# Patient Record
Sex: Male | Born: 1985 | Race: Black or African American | Hispanic: No | Marital: Married | Smoking: Never smoker
Health system: Southern US, Community
[De-identification: ages and names within clinical notes are randomized; demographics above are authoritative.]

---

## 2019-04-09 ENCOUNTER — Ambulatory Visit: Payer: Self-pay | Attending: Family

## 2019-04-09 DIAGNOSIS — Z23 Encounter for immunization: Secondary | ICD-10-CM

## 2019-04-09 NOTE — Progress Notes (Signed)
   Covid-19 Vaccination Clinic  Name:  Eugene Blanchard    MRN: [id number] DOB: 02/13/1985  04/09/2019  [name] was observed post Covid-19 immunization for 15 minutes without incident. He was provided with Vaccine Information Sheet and instruction to access the V-Safe system.   [name] was instructed to call 911 with any severe reactions post vaccine: Marland Kitchen Difficulty breathing  . Swelling of face and throat  . A fast heartbeat  . A bad rash all over body  . Dizziness and weakness   Immunizations Administered    Name Date Dose VIS Date Route   Moderna COVID-19 Vaccine 04/09/2019  2:10 PM 0.5 mL 12/30/2018 Intramuscular   Manufacturer: Moderna   Lot: [id number]   NDC: 62836-629-47

## 2019-05-12 ENCOUNTER — Ambulatory Visit: Payer: Self-pay | Attending: Family

## 2019-05-12 ENCOUNTER — Other Ambulatory Visit: Payer: Self-pay

## 2019-05-12 DIAGNOSIS — Z23 Encounter for immunization: Secondary | ICD-10-CM

## 2019-05-12 NOTE — Progress Notes (Signed)
   Covid-19 Vaccination Clinic  Name:  Eugene Blanchard    MRN: [id number] DOB: 06/19/1985  05/12/2019  Eugene Blanchard was observed post Covid-19 immunization for 15 minutes without incident. He was provided with Vaccine Information Sheet and instruction to access the V-Safe system.   Eugene Blanchard was instructed to call 911 with any severe reactions post vaccine: Marland Kitchen Difficulty breathing  . Swelling of face and throat  . A fast heartbeat  . A bad rash all over body  . Dizziness and weakness   Immunizations Administered    Name Date Dose VIS Date Route   Moderna COVID-19 Vaccine 05/12/2019 11:40 AM 0.5 mL 12/30/2018 Intramuscular   Manufacturer: Moderna   Lot: [id number]   NDC: 52174-715-95

## 2019-12-09 ENCOUNTER — Ambulatory Visit: Payer: Self-pay | Attending: Family

## 2019-12-09 DIAGNOSIS — Z23 Encounter for immunization: Secondary | ICD-10-CM

## 2019-12-28 DIAGNOSIS — R946 Abnormal results of thyroid function studies: Secondary | ICD-10-CM | POA: Diagnosis not present

## 2019-12-28 DIAGNOSIS — R1084 Generalized abdominal pain: Secondary | ICD-10-CM | POA: Diagnosis not present

## 2019-12-28 DIAGNOSIS — E039 Hypothyroidism, unspecified: Secondary | ICD-10-CM | POA: Diagnosis not present

## 2019-12-30 DIAGNOSIS — R634 Abnormal weight loss: Secondary | ICD-10-CM | POA: Diagnosis not present

## 2019-12-30 DIAGNOSIS — R1013 Epigastric pain: Secondary | ICD-10-CM | POA: Diagnosis not present

## 2019-12-30 DIAGNOSIS — R197 Diarrhea, unspecified: Secondary | ICD-10-CM | POA: Diagnosis not present

## 2019-12-31 ENCOUNTER — Other Ambulatory Visit: Payer: Self-pay | Admitting: Gastroenterology

## 2019-12-31 DIAGNOSIS — R1011 Right upper quadrant pain: Secondary | ICD-10-CM

## 2020-01-07 DIAGNOSIS — R1013 Epigastric pain: Secondary | ICD-10-CM | POA: Diagnosis not present

## 2020-01-07 DIAGNOSIS — B9681 Helicobacter pylori [H. pylori] as the cause of diseases classified elsewhere: Secondary | ICD-10-CM | POA: Diagnosis not present

## 2020-01-07 DIAGNOSIS — R634 Abnormal weight loss: Secondary | ICD-10-CM | POA: Diagnosis not present

## 2020-01-07 DIAGNOSIS — K2950 Unspecified chronic gastritis without bleeding: Secondary | ICD-10-CM | POA: Diagnosis not present

## 2020-01-15 ENCOUNTER — Ambulatory Visit
Admission: RE | Admit: 2020-01-15 | Discharge: 2020-01-15 | Disposition: A | Discharge status: Home / Self Care | Payer: BC Managed Care – PPO | Source: Ambulatory Visit | Attending: Gastroenterology | Admitting: Gastroenterology

## 2020-01-15 DIAGNOSIS — K8020 Calculus of gallbladder without cholecystitis without obstruction: Secondary | ICD-10-CM | POA: Diagnosis not present

## 2020-01-15 DIAGNOSIS — R1011 Right upper quadrant pain: Secondary | ICD-10-CM

## 2020-03-03 NOTE — Progress Notes (Signed)
   Covid-19 Vaccination Clinic  Name:  Eugene Blanchard    MRN: [id number] DOB: 01-16-1986  03/03/2020  [name] was observed post Covid-19 immunization for 15 minutes without incident. He was provided with Vaccine Information Sheet and instruction to access the V-Safe system.   [name] was instructed to call 911 with any severe reactions post vaccine: Marland Kitchen Difficulty breathing  . Swelling of face and throat  . A fast heartbeat  . A bad rash all over body  . Dizziness and weakness   Immunizations Administered    Name Date Dose VIS Date Route   Moderna Covid-19 Booster Vaccine 12/09/2019 10:30 AM 0.25 mL 11/18/2019 Intramuscular   Manufacturer: Moderna   Lot: [id number]   NDC: 54562-563-89

## 2020-09-08 DIAGNOSIS — R946 Abnormal results of thyroid function studies: Secondary | ICD-10-CM | POA: Diagnosis not present

## 2020-09-08 DIAGNOSIS — R1084 Generalized abdominal pain: Secondary | ICD-10-CM | POA: Diagnosis not present

## 2021-02-20 DIAGNOSIS — R1030 Lower abdominal pain, unspecified: Secondary | ICD-10-CM | POA: Diagnosis not present

## 2021-04-26 ENCOUNTER — Ambulatory Visit (HOSPITAL_COMMUNITY)
Admission: EM | Admit: 2021-04-26 | Discharge: 2021-04-26 | Disposition: A | Discharge status: Home / Self Care | Payer: BC Managed Care – PPO | Attending: Physician Assistant | Admitting: Physician Assistant

## 2021-04-26 ENCOUNTER — Encounter (HOSPITAL_COMMUNITY): Payer: Self-pay

## 2021-04-26 DIAGNOSIS — R1030 Lower abdominal pain, unspecified: Secondary | ICD-10-CM | POA: Diagnosis not present

## 2021-04-26 DIAGNOSIS — R198 Other specified symptoms and signs involving the digestive system and abdomen: Secondary | ICD-10-CM | POA: Diagnosis not present

## 2021-04-26 LAB — POCT URINALYSIS DIPSTICK, ED / UC
Bilirubin Urine: NEGATIVE
Glucose, UA: NEGATIVE mg/dL
Ketones, ur: NEGATIVE mg/dL
Leukocytes,Ua: NEGATIVE
Nitrite: NEGATIVE
Protein, ur: NEGATIVE mg/dL
Specific Gravity, Urine: 1.015 (ref 1.005–1.030)
Urobilinogen, UA: 0.2 mg/dL (ref 0.0–1.0)
pH: 6 (ref 5.0–8.0)

## 2021-04-26 MED ORDER — DICYCLOMINE HCL 20 MG PO TABS: 20.0000 mg | ORAL_TABLET | Freq: Two times a day (BID) | ORAL | 0 refills | Status: DC

## 2021-04-26 NOTE — Discharge Instructions (Signed)
Your urine did show some hemoglobin but was otherwise normal.  I would like you to follow-up with PCP.  We will try to establish you with 1 so someone to call to schedule an appointment.  Try dicyclomine twice daily.  You should avoid spicy/acidic/fatty foods.  Make sure you rest and drink plenty of fluid.  Please call to schedule an appointment with gastroenterology for further evaluation and management. ?

## 2021-04-26 NOTE — ED Triage Notes (Signed)
Pt presents with c/o abd pain X 10 days.  ? ?States he often has pain. Pt states he was diagnosed with IBS. Pt states the pain has worsened.  ?

## 2021-04-26 NOTE — ED Provider Notes (Signed)
?[facility] ? ? ? ?CSN: [id number] ?Arrival date & time: 04/26/21  1941 ? ? ?  ? ?History   ?Chief Complaint ?Chief Complaint  ?Patient presents with  ? Abdominal Pain  ? ? ?HPI ?Eugene Blanchard is a 36 y.o. male.  ? ?Patient presents today with a several year history of intermittent abdominal pain.  He reports this is generally widespread and cramping that improves minimally with a bowel movement.  Over the past 10 days this has become more persistent and is localized to lower abdomen.  He denies formal diagnosis of IBS but has been told this is the likely cause of symptoms.  He has previously been evaluated for acid reflux and H. pylori via EGD but has not had a colonoscopy.  He is not currently followed by GI specialist.  He denies any melena, hematochezia, nausea, vomiting, fever, weight loss.  He has taken Tylenol which provides temporary relief of symptoms.  Currently pain is rated 4 on a 0-10 pain scale, localized to lower abdomen, described as cramping, no aggravating relieving factors identified.  He denies any previous abdominal surgery and still has gallbladder and appendix.  Denies any recent medication changes, antibiotic use, suspicious food intake, recent travel, known sick contacts. ? ? ?History reviewed. No pertinent past medical history. ? ?There are no problems to display for this patient. ? ? ?History reviewed. No pertinent surgical history. ? ? ? ? ?Home Medications   ? ?Prior to Admission medications   ?Medication Sig Start Date End Date Taking? Authorizing Provider  ?dicyclomine (BENTYL) 20 MG tablet Take 1 tablet (20 mg total) by mouth 2 (two) times daily. 04/26/21  Yes Marlana Mckowen, Noberto Retort, PA-C  ? ? ?Family History ?History reviewed. No pertinent family history. ? ?Social History ?Social History  ? ?Tobacco Use  ? Smoking status: Never  ? Smokeless tobacco: Never  ?Substance Use Topics  ? Alcohol use: Never  ? Drug use: Never  ? ? ? ?Allergies   ?Patient has no known allergies. ? ? ?Review  of Systems ?Review of Systems  ?Constitutional:  Positive for activity change. Negative for appetite change, fatigue and fever.  ?Respiratory:  Negative for cough and shortness of breath.   ?Cardiovascular:  Negative for chest pain.  ?Gastrointestinal:  Positive for abdominal pain and diarrhea. Negative for nausea and vomiting.  ?Neurological:  Negative for dizziness, light-headedness and headaches.  ? ? ?Physical Exam ?Triage Vital Signs ?ED Triage Vitals  ?Enc Vitals Group  ?   BP 04/26/21 2004 121/73  ?   Pulse Rate 04/26/21 2004 75  ?   Resp 04/26/21 2004 17  ?   Temp 04/26/21 2004 98.2 ?F (36.8 ?C)  ?   Temp Source 04/26/21 2004 Oral  ?   SpO2 04/26/21 2004 100 %  ?   Weight --   ?   Height --   ?   Head Circumference --   ?   Peak Flow --   ?   Pain Score 04/26/21 2000 2  ?   Pain Loc --   ?   Pain Edu? --   ?   Excl. in GC? --   ? ?No data found. ? ?Updated Vital Signs ?BP 121/73 (BP Location: Right Arm)   Pulse 75   Temp 98.2 ?F (36.8 ?C) (Oral)   Resp 17   SpO2 100%  ? ?Visual Acuity ?Right Eye Distance:   ?Left Eye Distance:   ?Bilateral Distance:   ? ?Right Eye Near:   ?  Left Eye Near:    ?Bilateral Near:    ? ?Physical Exam ?Vitals reviewed.  ?Constitutional:   ?   General: He is awake.  ?   Appearance: Normal appearance. He is well-developed. He is not ill-appearing.  ?   Comments: Very pleasant male appears stated age in no acute distress sitting comfortably in exam room  ?HENT:  ?   Head: Normocephalic and atraumatic.  ?   Mouth/Throat:  ?   Mouth: Mucous membranes are moist.  ?   Pharynx: Uvula midline. No oropharyngeal exudate or posterior oropharyngeal erythema.  ?Cardiovascular:  ?   Rate and Rhythm: Normal rate and regular rhythm.  ?   Heart sounds: Normal heart sounds, S1 normal and S2 normal. No murmur heard. ?Pulmonary:  ?   Effort: Pulmonary effort is normal.  ?   Breath sounds: Normal breath sounds. No stridor. No wheezing, rhonchi or rales.  ?   Comments: Clear to auscultation  bilaterally ?Abdominal:  ?   General: Bowel sounds are normal.  ?   Palpations: Abdomen is soft.  ?   Tenderness: There is abdominal tenderness in the right lower quadrant, suprapubic area and left lower quadrant. There is no right CVA tenderness, left CVA tenderness, guarding or rebound.  ?   Comments: Tenderness palpation throughout lower abdomen.  No evidence of acute abdomen on physical exam.  ?Neurological:  ?   Mental Status: He is alert.  ?Psychiatric:     ?   Behavior: Behavior is cooperative.  ? ? ? ?UC Treatments / Results  ?Labs ?(all labs ordered are listed, but only abnormal results are displayed) ?Labs Reviewed  ?POCT URINALYSIS DIPSTICK, ED / UC - Abnormal; Notable for the following components:  ?    Result Value  ? Hgb urine dipstick TRACE (*)   ? All other components within normal limits  ?CYTOLOGY, (ORAL, ANAL, URETHRAL) ANCILLARY ONLY  ? ? ?EKG ? ? ?Radiology ?No results found. ? ?Procedures ?Procedures (including critical care time) ? ?Medications Ordered in UC ?Medications - No data to display ? ?Initial Impression / Assessment and Plan / UC Course  ?I have reviewed the triage vital signs and the nursing notes. ? ?Pertinent labs & imaging results that were available during my care of the patient were reviewed by me and considered in my medical decision making (see chart for details). ? ?  ? ?Vital signs and physical exam reassuring today; no indication for emergent evaluation or imaging.  UA showed trace hemoglobin without signs of infection.  Suspect irritable bowel given clinical presentation.  Recommended he follow-up with GI for further evaluation and management including consideration of colonoscopy.  He was given contact information for local provider and encouraged to call to schedule an appointment.  We will start dicyclomine to help manage symptoms in the meantime.  Recommended bland diet and drinking plenty of fluids.  Discussed that if his symptoms worsen anyway and he has a severe  focal abdominal pain or develops any fever, melena, hematochezia, nausea, vomiting he needs to go to the emergency room for further evaluation since we do not have imaging capabilities in urgent care; patient expressed understanding.  Strict return precautions given. ? ?Final Clinical Impressions(s) / UC Diagnoses  ? ?Final diagnoses:  ?Lower abdominal pain  ?Abnormal bowel movement  ? ? ? ?Discharge Instructions   ? ?  ?Your urine did show some hemoglobin but was otherwise normal.  I would like you to follow-up with PCP.  We will try  to establish you with 1 so someone to call to schedule an appointment.  Try dicyclomine twice daily.  You should avoid spicy/acidic/fatty foods.  Make sure you rest and drink plenty of fluid.  Please call to schedule an appointment with gastroenterology for further evaluation and management. ? ? ? ?ED Prescriptions   ? ? Medication Sig Dispense Auth. Provider  ? dicyclomine (BENTYL) 20 MG tablet Take 1 tablet (20 mg total) by mouth 2 (two) times daily. 20 tablet Kerra Guilfoil K, PA-C  ? ?  ? ?PDMP not reviewed this encounter. ?  ?Jeani Hawking, PA-C ?04/26/21 2126 ? ?

## 2021-04-26 NOTE — ED Notes (Addendum)
Wrong patient documentation.

## 2021-04-27 ENCOUNTER — Encounter: Payer: Self-pay | Admitting: Physician Assistant

## 2021-04-27 LAB — CYTOLOGY, (ORAL, ANAL, URETHRAL) ANCILLARY ONLY
Chlamydia: NEGATIVE
Comment: NEGATIVE
Comment: NEGATIVE
Comment: NORMAL
Neisseria Gonorrhea: NEGATIVE
Trichomonas: NEGATIVE

## 2021-05-17 ENCOUNTER — Other Ambulatory Visit: Payer: BC Managed Care – PPO

## 2021-05-17 ENCOUNTER — Ambulatory Visit: Payer: BC Managed Care – PPO | Admitting: Physician Assistant

## 2021-05-17 ENCOUNTER — Encounter: Payer: Self-pay | Admitting: Physician Assistant

## 2021-05-17 VITALS — BP 100/60 | HR 73 | Ht 71.0 in | Wt 131.4 lb

## 2021-05-17 DIAGNOSIS — R194 Change in bowel habit: Secondary | ICD-10-CM | POA: Diagnosis not present

## 2021-05-17 DIAGNOSIS — K2970 Gastritis, unspecified, without bleeding: Secondary | ICD-10-CM

## 2021-05-17 DIAGNOSIS — R1030 Lower abdominal pain, unspecified: Secondary | ICD-10-CM | POA: Diagnosis not present

## 2021-05-17 DIAGNOSIS — B9681 Helicobacter pylori [H. pylori] as the cause of diseases classified elsewhere: Secondary | ICD-10-CM

## 2021-05-17 MED ORDER — DICYCLOMINE HCL 20 MG PO TABS: 20.0000 mg | ORAL_TABLET | Freq: Two times a day (BID) | ORAL | 2 refills | Status: AC

## 2021-05-17 NOTE — Progress Notes (Addendum)
? ?Chief Complaint: Lower abdominal pain and change in bowel habits ? ?HPI: ?   Eugene Blanchard is a 36 year old African male, who presents to clinic today after being seen in the ER for lower abdominal pain. ?   01/15/2020 right upper quadrant ultrasound with cholelithiasis without evidence of acute cholecystitis ?   04/26/2021 patient presented to the ER with a several year history of intermittent lower abdominal pain which she described as widespread cramping that minimally improved with a bowel movement.  It has been persistent over the past 10 days.  Described previous EGD and diagnosis of H. pylori but no previous colonoscopy.  At that time urinalysis was normal.  Was recommended he follow-up with a gastroenterologist for possible colonoscopy.  He was given Dicyclomine. ?   Today, the patient tells me that he moved here in 2019 and since then he has had some trouble with what has been discussed as IBS symptoms in the past by his school nurse.  Tells me he develops lower abdominal pain which can be anywhere from a 4-7/10 and with this pain comes more frequent mushy/liquid bowel movements.  The pain does minimally improve after a bowel movement.  Tells me this will last for at least 2 weeks and be constant, the things that seem to help are sometimes exercise and a change in his diet towards a low FODMAP diet as well as a fiber supplement and heating pads or hot shower.  Tells me this has happened approximately 4-5 times a year over the past 2 to 3 years.  Also worsened by spicy foods and cold weather as well as stress and anxiety.  Notes weight loss as well during these episodes because he does not eat his normal diet.  Most recently was around 145 pounds in January down to 131 today.  Did try Dicyclomine prescribed in the ER and took this 20 mg twice a day which helped with the pain but he does not want to "cover up something".  Tells me when he is on the low FODMAP diet he has low energy. ?   Describes previous  EGD and diagnosis of H. pylori which he never took the treatment for. ?   Student at a local college. ?   Denies fever, chills, blood in his stool or symptoms that awaken him from sleep. ? ?Past medical history: ?H. pylori ? ?Past surgical history: ?None ? ?Current Outpatient Medications  ?Medication Sig Dispense Refill  ? dicyclomine (BENTYL) 20 MG tablet Take 1 tablet (20 mg total) by mouth 2 (two) times daily. 20 tablet 0  ? ?No current facility-administered medications for this visit.  ? ? ?Allergies as of 05/17/2021  ? (No Known Allergies)  ? ? ?Family history: ?No GI cancers ? ?Social History  ? ?Socioeconomic History  ? Marital status: Married  ?  Spouse name: Not on file  ? Number of children: Not on file  ? Years of education: Not on file  ? Highest education level: Not on file  ?Occupational History  ? Not on file  ?Tobacco Use  ? Smoking status: Never  ? Smokeless tobacco: Never  ?Substance and Sexual Activity  ? Alcohol use: Never  ? Drug use: Never  ? Sexual activity: Not on file  ?Other Topics Concern  ? Not on file  ?Social History Narrative  ? Not on file  ? ?Social Determinants of Health  ? ?Financial Resource Strain: Not on file  ?Food Insecurity: Not on file  ?Transportation Needs:  Not on file  ?Physical Activity: Not on file  ?Stress: Not on file  ?Social Connections: Not on file  ?Intimate Partner Violence: Not on file  ? ? ?Review of Systems:    ?Constitutional: No weight loss, fever or chills ?Skin: No rash ?Cardiovascular: No chest pain ?Respiratory: No SOB  ?Gastrointestinal: See HPI and otherwise negative ?Genitourinary: No dysuria  ?Neurological: No headache, dizziness or syncope ?Musculoskeletal: No new muscle or joint pain ?Hematologic: No bleeding  ?Psychiatric: No history of depression or anxiety ? ? Physical Exam:  ?Vital signs: ?BP 100/60   Pulse 73   Ht 5\' 11"  (1.803 m)   Wt 131 lb 6.4 oz (59.6 kg)   BMI 18.33 kg/m?   ? ?Constitutional:   Pleasant African male appears to be in  NAD, Well developed, Well nourished, alert and cooperative ?Head:  Normocephalic and atraumatic. ?Eyes:   PEERL, EOMI. No icterus. Conjunctiva pink. ?Ears:  Normal auditory acuity. ?Neck:  Supple ?Throat: Oral cavity and pharynx without inflammation, swelling or lesion.  ?Respiratory: Respirations even and unlabored. Lungs clear to auscultation bilaterally.   No wheezes, crackles, or rhonchi.  ?Cardiovascular: Normal S1, S2. No MRG. Regular rate and rhythm. No peripheral edema, cyanosis or pallor.  ?Gastrointestinal:  Soft, nondistended, mild suprapubic/lower abdominal TTP, no rebound or guarding. Normal bowel sounds. No appreciable masses or hepatomegaly. ?Rectal:  Not performed.  ?Msk:  Symmetrical without gross deformities. Without edema, no deformity or joint abnormality.  ?Neurologic:  Alert and  oriented x4;  grossly normal neurologically.  ?Skin:   Dry and intact without significant lesions or rashes. ?Psychiatric: Demonstrates good judgement and reason without abnormal affect or behaviors. ? ?See HPI for recent labs and imaging. ? ?Assessment: ?1.  Lower abdominal pain: Bilateral lower abdominal pain/suprapubic about 4-5 times a year which can last for 2 weeks and comes with more frequent loose stools, often during times of stress/anxiety or after eating trigger foods, Dicyclomine helped recently; most likely IBS ?2.  Change in bowel habits: As above, currently normal ?3.  History of H. pylori: Describes EGD last year with Dr. and diagnosed with H. pylori but this was never treated ? ?Plan: ?1.  We will request records from Dr. Elnoria Howard in regards to EGD and biopsies.  If patient truly was positive for H. pylori then would recommend treatment for this. ?2.  Scheduled patient for diagnostic colonoscopy in the [facility] with Dr. Elnoria Howard.  Did provide the patient a detailed list of risks for the procedure and he agrees to proceed. Patient is appropriate for endoscopic procedure(s) in the ambulatory ([facility]) setting.   ?3.  Discussed prescribing Dicyclomine but patient would prefer to wait on this until he gets an actual diagnosis. ?4.  Ordered labs for celiac studies today. ?5.  Patient to follow in clinic per recommendations from Dr. Christella Hartigan after time of procedure. ? ?Christella Hartigan, PA-C ?[facility] ?05/17/2021, 10:13 AM ? ?Addendum: ?05/18/2021 3:23 PM ? ?Received records from patient's last EGD 01/07/2020 ? ?Patient had EGD with Dr. 14/09/2019.  Noted to have normal esophagus, stomach and duodenum.  Biopsies were positive for H. 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 ? ?Patient will need treatment for H. pylori with quadruple therapy.  We will ensure this is prescribed for him.  He will then also need repeat H. pylori breath test in 4 to 6 weeks after finishing treatment. ? ?Above could be contributing to his current symptoms. ? ?Elnoria Howard, PA-C ? ?

## 2021-05-17 NOTE — Patient Instructions (Signed)
Your provider has requested that you go to the basement level for lab work before leaving today. Press "B" on the elevator. The lab is located at the first door on the left as you exit the elevator. ? ? ?You have been scheduled for a colonoscopy. Please follow written instructions given to you at your visit today.  ?Please pick up your prep supplies at the pharmacy within the next 1-3 days. ?If you use inhalers (even only as needed), please bring them with you on the day of your procedure. ? ?If you are age 39 or younger, your body mass index should be between 19-25. Your Body mass index is 18.33 kg/m?Marland Kitchen If this is out of the aformentioned range listed, please consider follow up with your Primary Care Provider.  ? ?________________________________________________________ ? ?The [facility] providers would like to encourage you to use [facility] to communicate with providers for non-urgent requests or questions.  Due to long hold times on the telephone, sending your provider a message by [facility] may be a faster and more efficient way to get a response.  Please allow 48 business hours for a response.  Please remember that this is for non-urgent requests.  ?_______________________________________________________ ? ?

## 2021-05-17 NOTE — Progress Notes (Signed)
I agree with the above note, plan 

## 2021-05-17 NOTE — Telephone Encounter (Signed)
Yes, Dicyclomine 20mg  BID. #60 refill x2  ?Thanks-JLL  ? ?RX for Dicyclomine sent to pharmacy on file. ?

## 2021-05-18 ENCOUNTER — Telehealth: Payer: Self-pay

## 2021-05-18 DIAGNOSIS — A048 Other specified bacterial intestinal infections: Secondary | ICD-10-CM

## 2021-05-18 LAB — IGA: Immunoglobulin A: 172 mg/dL (ref 47–310)

## 2021-05-18 LAB — TISSUE TRANSGLUTAMINASE ABS,IGG,IGA
(tTG) Ab, IgA: <1 U/mL
(tTG) Ab, IgG: <1 U/mL

## 2021-05-18 MED ORDER — METRONIDAZOLE 250 MG PO TABS: 250.0000 mg | ORAL_TABLET | Freq: Four times a day (QID) | ORAL | 0 refills | Status: AC

## 2021-05-18 MED ORDER — BISMUTH SUBSALICYLATE 262 MG PO TABS
524.0000 mg | ORAL_TABLET | Freq: Four times a day (QID) | ORAL | 0 refills | Status: AC
Start: 2021-05-18 — End: 2021-06-01

## 2021-05-18 MED ORDER — TETRACYCLINE HCL 500 MG PO CAPS: 500.0000 mg | ORAL_CAPSULE | Freq: Four times a day (QID) | ORAL | 0 refills | Status: AC

## 2021-05-18 MED ORDER — OMEPRAZOLE 20 MG PO CPDR: 20.0000 mg | DELAYED_RELEASE_CAPSULE | Freq: Two times a day (BID) | ORAL | 0 refills | Status: AC

## 2021-05-18 NOTE — Telephone Encounter (Signed)
Patient notified of results and recommendations via MyChart message. ?Prescriptions sent to pharmacy on file. ?Lab order and reminder in epic. ?

## 2021-05-18 NOTE — Telephone Encounter (Signed)
-----   Message from Unk Lightning, [place] sent at 05/18/2021  3:25 PM EDT ----- ?Regarding: h pylori ?Please let patient know that we did get his results from EGD with Dr. Elnoria Howard in December 2021.  This was positive for H. pylori which was never treated.  Please prescribe quadruple therapy for him now as well as repeat H. pylori breath testing 4 to 6 weeks afterwards. ? ?Omeprazole 20 mg twice dailyx14d ? ?Bismuth 300-524 mg 4 times dailyx14d ? ?Tetracycline 500 mg 4 times dailyx14d ? ?Metronidazole 250 mg 4 times dailyx14d ? ?Thanks, JL L ? ? ? ?

## 2021-05-19 NOTE — Telephone Encounter (Signed)
Patient reviewed MyChart message. ?Last read by Pearletha Forge at  9:30 AM on 05/19/2021. ?

## 2021-05-24 ENCOUNTER — Encounter: Payer: Self-pay | Admitting: Gastroenterology

## 2021-05-31 ENCOUNTER — Encounter: Payer: Self-pay | Admitting: Gastroenterology

## 2021-05-31 ENCOUNTER — Ambulatory Visit (AMBULATORY_SURGERY_CENTER): Payer: BC Managed Care – PPO | Admitting: Gastroenterology

## 2021-05-31 VITALS — BP 109/69 | HR 66 | Temp 97.8°F | Resp 10 | Ht 71.0 in | Wt 131.0 lb

## 2021-05-31 DIAGNOSIS — R194 Change in bowel habit: Secondary | ICD-10-CM

## 2021-05-31 DIAGNOSIS — R1030 Lower abdominal pain, unspecified: Secondary | ICD-10-CM | POA: Diagnosis not present

## 2021-05-31 DIAGNOSIS — A048 Other specified bacterial intestinal infections: Secondary | ICD-10-CM

## 2021-05-31 DIAGNOSIS — B9681 Helicobacter pylori [H. pylori] as the cause of diseases classified elsewhere: Secondary | ICD-10-CM

## 2021-05-31 MED ORDER — SODIUM CHLORIDE 0.9 % IV SOLN: 500.0000 mL | Freq: Once | INTRAVENOUS | Status: DC

## 2021-05-31 NOTE — Progress Notes (Signed)
?  The recent H&P (dated in the past 30 days)) was reviewed, the patient was examined and there is no change in the patients condition since that H&P was completed. ? ? ?Eugene Blanchard  05/31/2021, 2:38 PM ? ?

## 2021-05-31 NOTE — Patient Instructions (Signed)
Please complete the H. pylori antibiotics that were called in a few weeks ago and ?continue with plans for H. pylori breath testing 6-8 weeks after you have completed those ?antibiotics. ? ?YOU HAD AN ENDOSCOPIC PROCEDURE TODAY: Refer to the procedure report and other information in the discharge instructions given to you for any specific questions about what was found during the examination. If this information does not answer your questions, please call [place] office at [phone] to clarify.  ? ?YOU SHOULD EXPECT: Some feelings of bloating in the abdomen. Passage of more gas than usual. Walking can help get rid of the air that was put into your GI tract during the procedure and reduce the bloating. If you had a lower endoscopy (such as a colonoscopy or flexible sigmoidoscopy) you may notice spotting of blood in your stool or on the toilet paper. Some abdominal soreness may be present for a day or two, also. ? ?DIET: Your first meal following the procedure should be a light meal and then it is ok to progress to your normal diet. A half-sandwich or bowl of soup is an example of a good first meal. Heavy or fried foods are harder to digest and may make you feel nauseous or bloated. Drink plenty of fluids but you should avoid alcoholic beverages for 24 hours. If you had a esophageal dilation, please see attached instructions for diet.   ? ?ACTIVITY: Your care partner should take you home directly after the procedure. You should plan to take it easy, moving slowly for the rest of the day. You can resume normal activity the day after the procedure however YOU SHOULD NOT DRIVE, use power tools, machinery or perform tasks that involve climbing or major physical exertion for 24 hours (because of the sedation medicines used during the test).  ? ?SYMPTOMS TO REPORT IMMEDIATELY: ?A gastroenterologist can be reached at any hour. Please call [phone]  for any of the following symptoms:  ?Following lower endoscopy  (colonoscopy, flexible sigmoidoscopy) ?Excessive amounts of blood in the stool  ?Significant tenderness, worsening of abdominal pains  ?Swelling of the abdomen that is new, acute  ?Fever of 100? or higher  ? ?FOLLOW UP:  ?If any biopsies were taken you will be contacted by phone or by letter within the next 1-3 weeks. Call [phone]  if you have not heard about the biopsies in 3 weeks.  ?Please also call with any specific questions about appointments or follow up tests.  ?

## 2021-05-31 NOTE — Progress Notes (Signed)
Pt non-responsive, VVS, Report to RN  °

## 2021-05-31 NOTE — Op Note (Signed)
[place] ?Patient Name: Eugene Blanchard ?Procedure Date: 05/31/2021 2:40 PM ?MRN: [id number] ?Endoscopist: Milus Banister , MD ?Age: 36 ?Referring MD:  ?Date of Birth: 1986/01/12 ?Gender: Male ?Account #: [id number] ?Procedure:                Colonoscopy ?Indications:              Lower abdominal pain, intermittent ?Medicines:                Monitored Anesthesia Care ?Procedure:                Pre-Anesthesia Assessment: ?                          - Prior to the procedure, a History and Physical  ?                          was performed, and patient medications and  ?                          allergies were reviewed. The patient's tolerance of  ?                          previous anesthesia was also reviewed. The risks  ?                          and benefits of the procedure and the sedation  ?                          options and risks were discussed with the patient.  ?                          All questions were answered, and informed consent  ?                          was obtained. Prior Anticoagulants: The patient has  ?                          taken no previous anticoagulant or antiplatelet  ?                          agents. ASA Grade Assessment: II - A patient with  ?                          mild systemic disease. After reviewing the risks  ?                          and benefits, the patient was deemed in  ?                          satisfactory condition to undergo the procedure. ?                          After obtaining informed consent, the colonoscope  ?  was passed under direct vision. Throughout the  ?                          procedure, the patient's blood pressure, pulse, and  ?                          oxygen saturations were monitored continuously. The  ?                          Olympus [id number]) Colonoscope was  ?                          introduced through the anus and advanced to the the  ?                          cecum, identified by  appendiceal orifice and  ?                          ileocecal valve. The colonoscopy was performed  ?                          without difficulty. The patient tolerated the  ?                          procedure well. The quality of the bowel  ?                          preparation was good. The ileocecal valve,  ?                          appendiceal orifice, and rectum were photographed. ?Scope In: 3:00:09 PM ?Scope Out: 3:12:16 PM ?Scope Withdrawal Time: 0 hours 8 minutes 47 seconds  ?Total Procedure Duration: 0 hours 12 minutes 7 seconds  ?Findings:                 The entire examined colon appeared normal on direct  ?                          and retroflexion views. ?Complications:            No immediate complications. Estimated blood loss:  ?                          None. ?Estimated Blood Loss:     Estimated blood loss: none. ?Impression:               - The entire examined colon is normal on direct and  ?                          retroflexion views. ?                          - No polyps or cancers. ?Recommendation:           - Patient has a contact number available for  ?  emergencies. The signs and symptoms of potential  ?                          delayed complications were discussed with the  ?                          patient. Return to normal activities tomorrow.  ?                          Written discharge instructions were provided to the  ?                          patient. ?                          - Resume previous diet. ?                          - Continue present medications. ?                          - Please complete the H. pylori antibiotics that  ?                          were called in a few weeks ago and continue with  ?                          plans for H. pylori breath testing 6-8 weeks after  ?                          you have completed those antibiotics. ?Milus Banister, MD ?05/31/2021 3:16:07 PM ?This report has been signed electronically. ?

## 2021-05-31 NOTE — Progress Notes (Signed)
Pt's states no medical or surgical changes since previsit or office visit. VS assessed by C.W 

## 2021-06-02 ENCOUNTER — Telehealth: Payer: Self-pay

## 2021-06-02 NOTE — Telephone Encounter (Signed)
?  Follow up Call- ? ? ?  05/31/2021  ?  2:39 PM  ?Call back number  ?Post procedure Call Back phone  # [phone]  ?Permission to leave phone message Yes  ?  ? ?Patient questions: ? ?Do you have a fever, pain , or abdominal swelling? No. ?Pain Score  0 * ? ?Have you tolerated food without any problems? Yes.   ? ?Have you been able to return to your normal activities? Yes.   ? ?Do you have any questions about your discharge instructions: ?Diet   No. ?Medications  No. ?Follow up visit  No. ? ?Do you have questions or concerns about your Care? No. ? ?Actions: ?* If pain score is 4 or above: ?No action needed, pain <4. ? ? ?

## 2021-07-03 ENCOUNTER — Telehealth: Payer: Self-pay

## 2021-07-03 NOTE — Telephone Encounter (Signed)
-----   Message from Missy Sabins, RN sent at 05/18/2021  4:49 PM EDT ----- Regarding: Urea breath test Urea breath test due around 07/10/21 Mail pt instructions and order

## 2021-07-03 NOTE — Telephone Encounter (Signed)
Called and spoke with patient to remind him that he will be due for breath test next week. Pt states that he no longer lives in [place], he now lives in [place]. Pt states that he is planning on finding a doctor there. I asked if patient wanted me to update his address, he declined and stated that he will update it on his own. Pt had no concerns at the end of the call.

## 2021-12-22 IMAGING — US US ABDOMEN LIMITED
1 series · 14 of 25 positions shown · non-contrast
Comparison: None.

CLINICAL DATA: Right upper quadrant pain and diarrhea for the past
2 weeks.

EXAM:
ULTRASOUND ABDOMEN LIMITED RIGHT UPPER QUADRANT

[Series 1: us abdomen limited · 0.17mm/px · 14 of 36 slices shown]
[im 1/36]
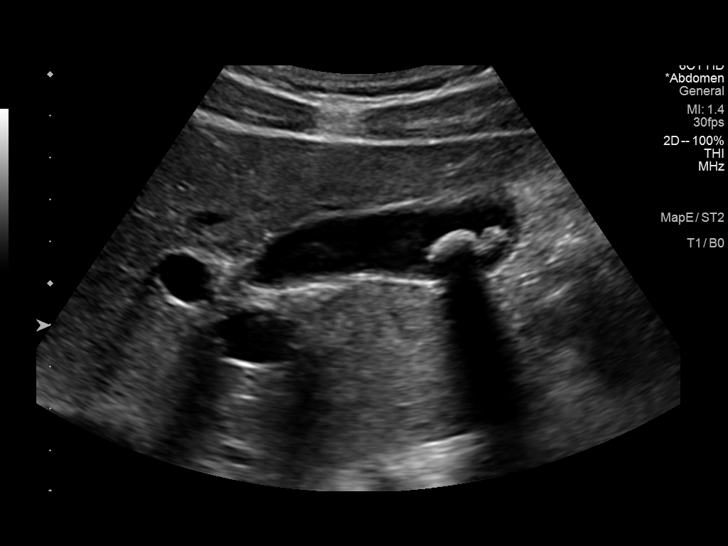
[im 3/36]
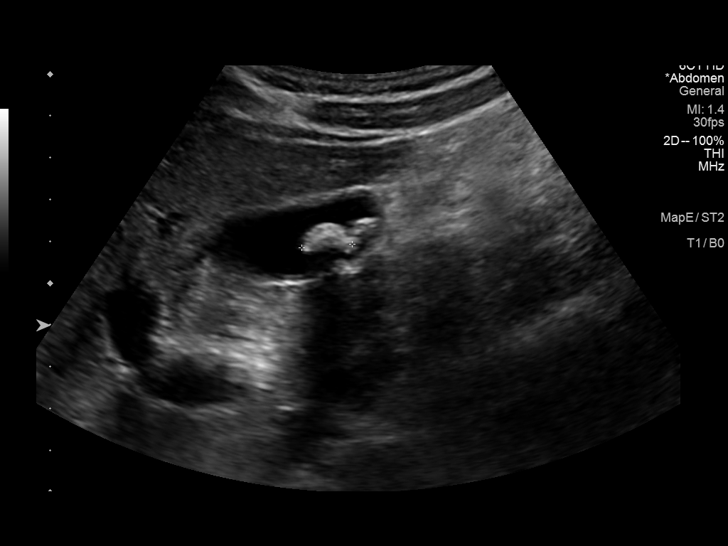
[im 6/36]
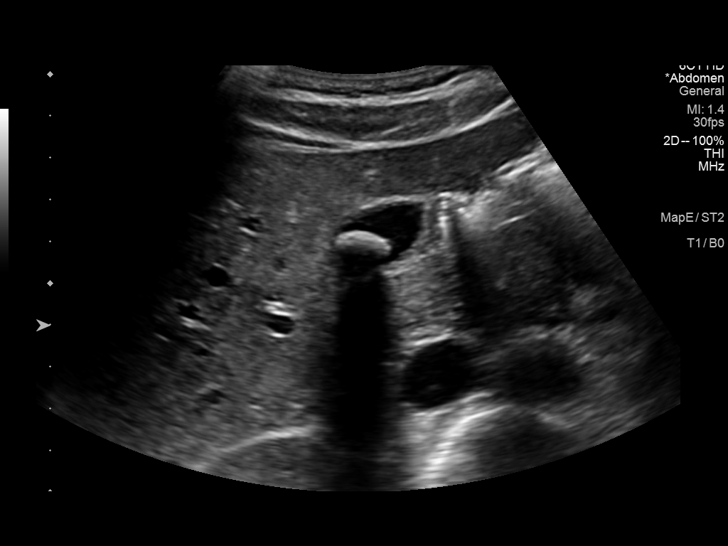
[im 9/36]
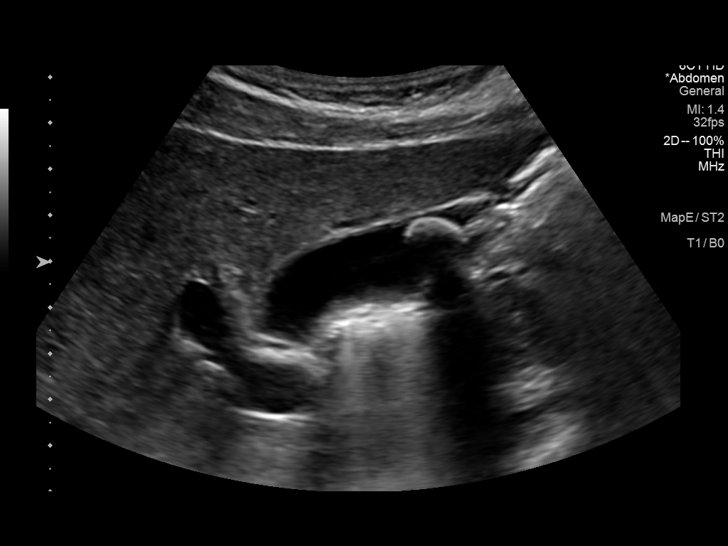
[im 12/36]
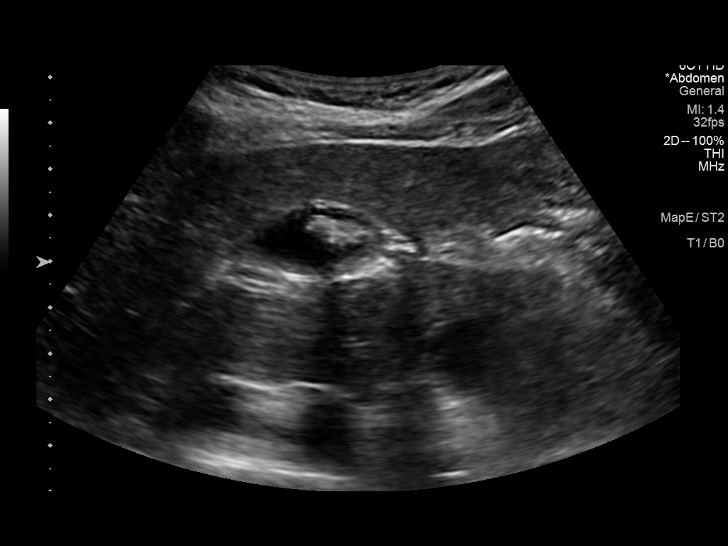
[im 14/36]
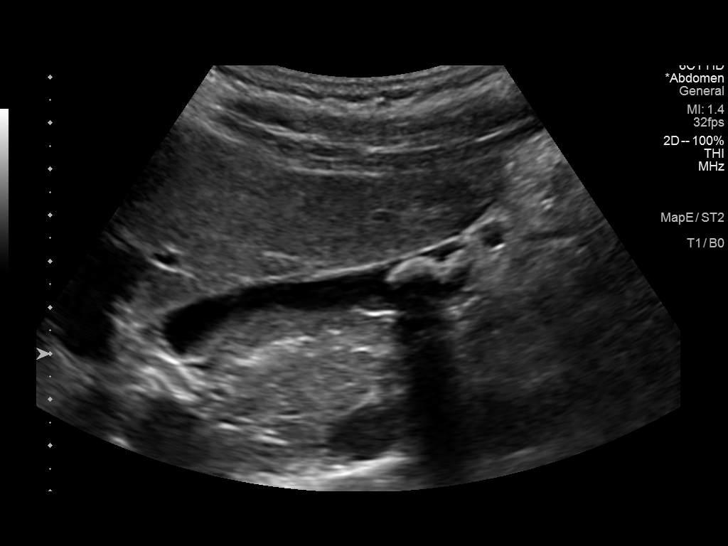
[im 17/36]
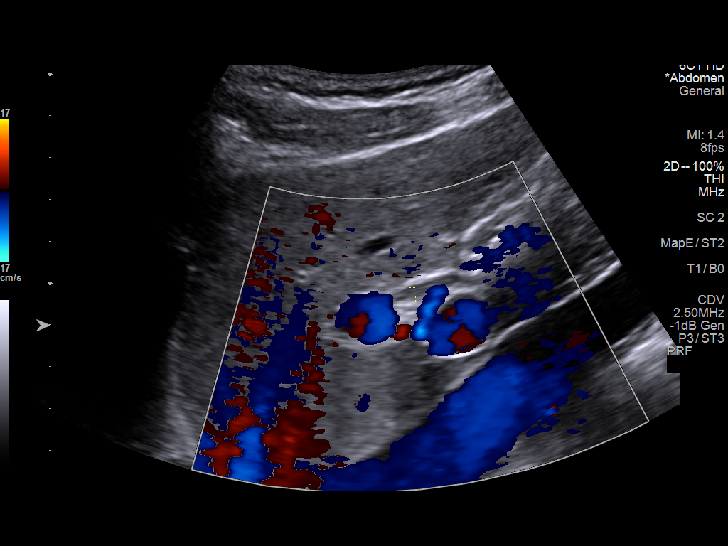
[im 19/36]
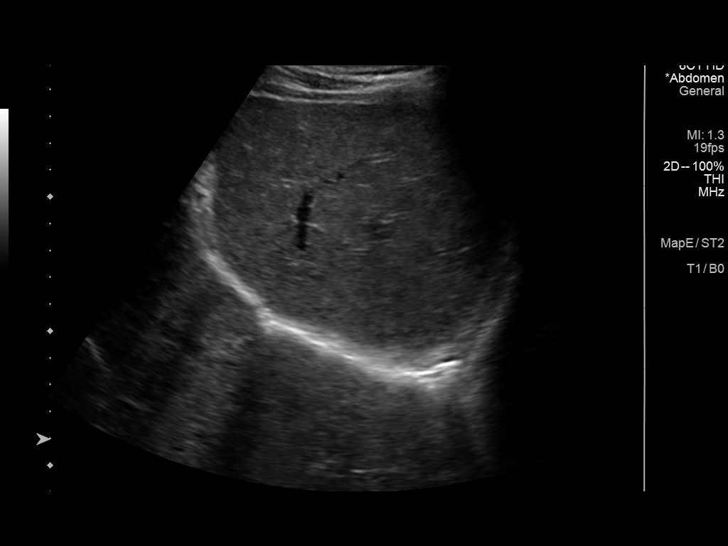
[im 22/36]
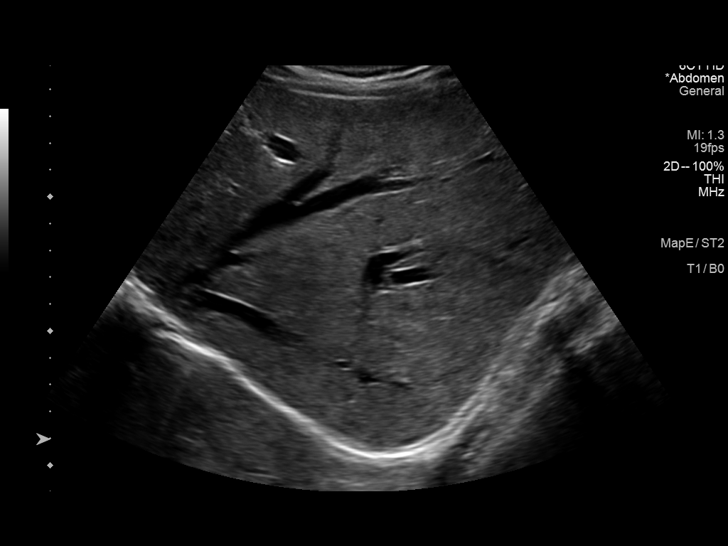
[im 24/36]
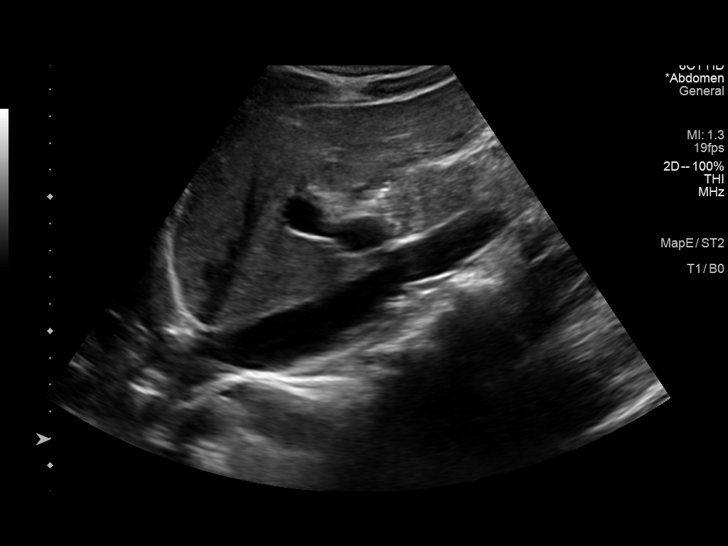
[im 27/36]
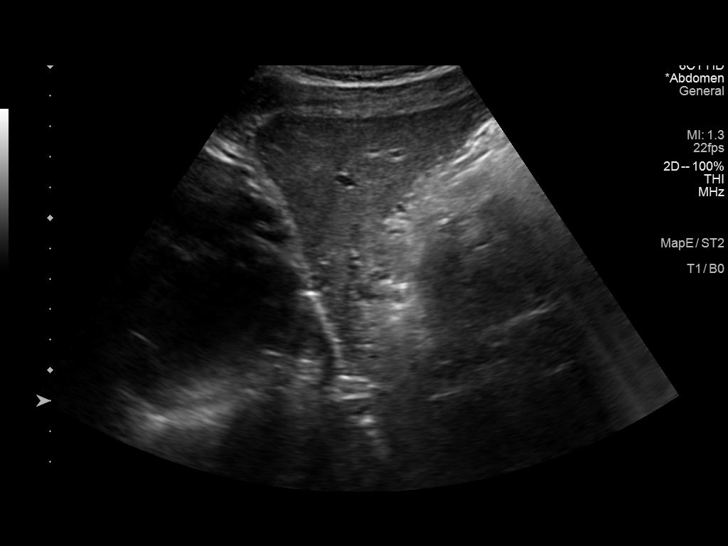
[im 30/36]
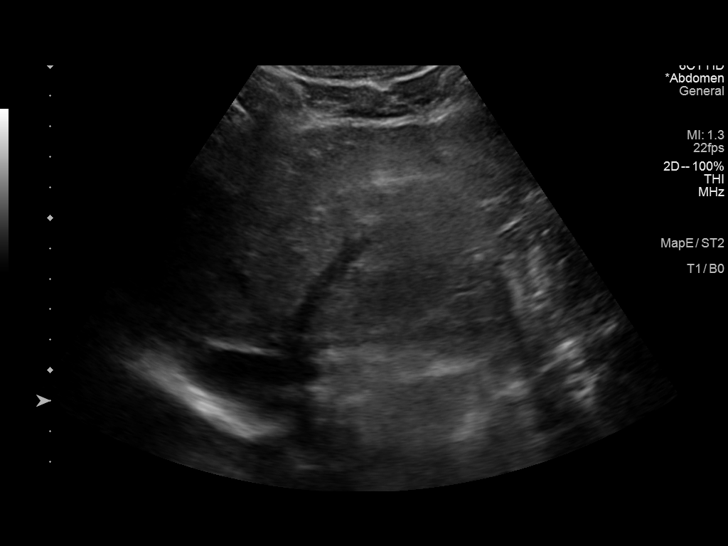
[im 33/36]
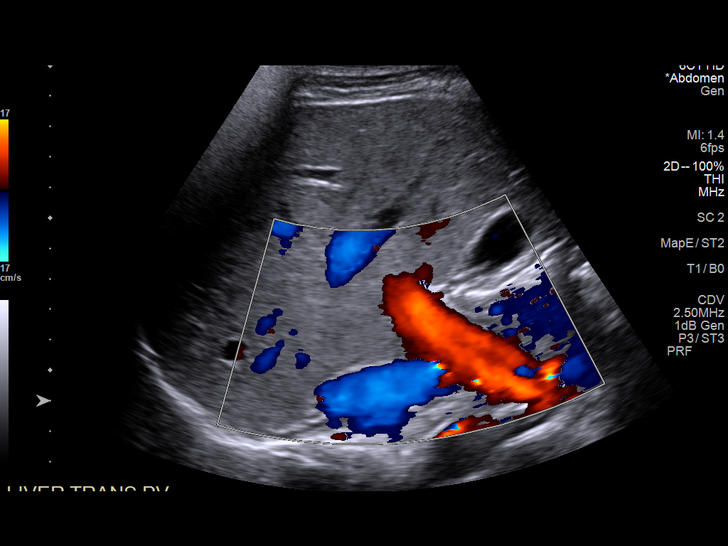
[im 36/36]
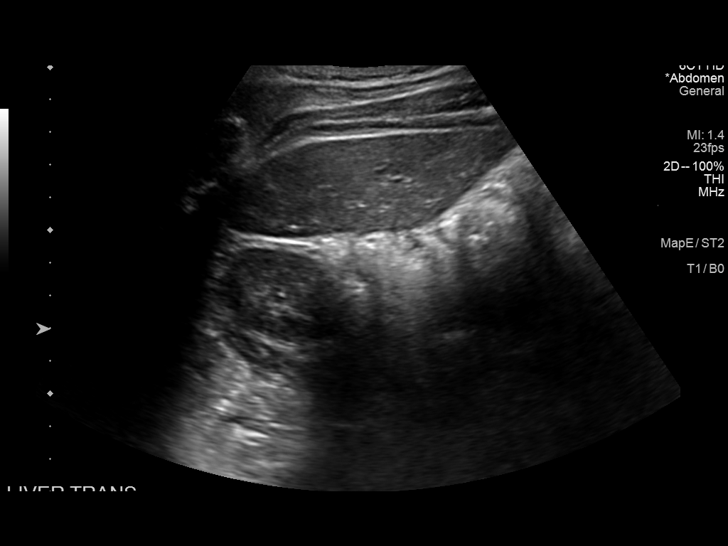

[14 of 25 positions shown; findings below may reference images not displayed]

FINDINGS: Gallbladder:

Multiple gallstones measuring up to 1.2 cm. No wall thickening
visualized. No sonographic Murphy sign noted by sonographer.

Common bile duct:

Diameter: 3 mm, normal.

Liver:

No focal lesion identified. Within normal limits in parenchymal
echogenicity. Portal vein is patent on color Doppler imaging with
normal direction of blood flow towards the liver.

Other: None.
IMPRESSION: 1. Cholelithiasis without sonographic evidence of acute
cholecystitis.
